# Patient Record
Sex: Male | Born: 1980 | Race: White | Hispanic: No | Marital: Married | Smoking: Never smoker
Health system: Southern US, Community
[De-identification: ages and names within clinical notes are randomized; demographics above are authoritative.]

## PROBLEM LIST (undated history)

## (undated) DIAGNOSIS — S069X9A Unspecified intracranial injury with loss of consciousness of unspecified duration, initial encounter: Secondary | ICD-10-CM

## (undated) DIAGNOSIS — S3992XA Unspecified injury of lower back, initial encounter: Secondary | ICD-10-CM

## (undated) DIAGNOSIS — F329 Major depressive disorder, single episode, unspecified: Secondary | ICD-10-CM

## (undated) DIAGNOSIS — S069XAA Unspecified intracranial injury with loss of consciousness status unknown, initial encounter: Secondary | ICD-10-CM

## (undated) DIAGNOSIS — F32A Depression, unspecified: Secondary | ICD-10-CM

## (undated) HISTORY — PX: BACK SURGERY: SHX140

## (undated) HISTORY — DX: Major depressive disorder, single episode, unspecified: F32.9

## (undated) HISTORY — DX: Depression, unspecified: F32.A

## (undated) HISTORY — PX: SPERMATOCELECTOMY: SHX2420

---

## 2008-03-04 ENCOUNTER — Ambulatory Visit: Payer: Self-pay | Admitting: Internal Medicine

## 2010-06-14 ENCOUNTER — Ambulatory Visit: Payer: Self-pay | Admitting: Internal Medicine

## 2011-04-01 ENCOUNTER — Ambulatory Visit: Payer: Self-pay | Admitting: Internal Medicine

## 2015-04-15 DIAGNOSIS — R1032 Left lower quadrant pain: Secondary | ICD-10-CM | POA: Insufficient documentation

## 2015-04-16 DIAGNOSIS — M5136 Other intervertebral disc degeneration, lumbar region: Secondary | ICD-10-CM | POA: Insufficient documentation

## 2015-09-11 ENCOUNTER — Encounter: Payer: Self-pay | Admitting: Family Medicine

## 2015-09-11 ENCOUNTER — Ambulatory Visit (INDEPENDENT_AMBULATORY_CARE_PROVIDER_SITE_OTHER): Payer: BLUE CROSS/BLUE SHIELD | Admitting: Family Medicine

## 2015-09-11 VITALS — BP 130/100 | HR 86 | Temp 98.3°F | Ht 75.0 in | Wt 240.0 lb

## 2015-09-11 DIAGNOSIS — J0100 Acute maxillary sinusitis, unspecified: Secondary | ICD-10-CM

## 2015-09-11 DIAGNOSIS — J40 Bronchitis, not specified as acute or chronic: Secondary | ICD-10-CM

## 2015-09-11 MED ORDER — GUAIFENESIN-CODEINE 100-10 MG/5ML PO SOLN: 5.0000 mL | Freq: Three times a day (TID) | ORAL | Status: DC | PRN

## 2015-09-11 MED ORDER — AMOXICILLIN-POT CLAVULANATE 875-125 MG PO TABS
1.0000 | ORAL_TABLET | Freq: Two times a day (BID) | ORAL | Status: DC
Start: 2015-09-11 — End: 2015-11-18

## 2015-09-11 NOTE — Patient Instructions (Signed)
The DASH Diet  Dietary Approaches to Stop Hypertension   What is hypertension?  Hypertension is the term for blood pressure that is consistently higher than normal. Blood pressure is the force of blood against artery walls. Blood pressure can be unhealthy if it is above 120/80. The higher your blood pressure, the greater the health risk. High blood pressure can be controlled if you take these steps:  . Maintain a healthy weight.  . Be physically active.  . Follow a healthy eating plan, which includes foods lower in salt and sodium.  . If you drink alcoholic beverages, do so in moderation.  As noted in this list, diet affects high blood pressure. Following the DASH diet and reducing the amount of sodium in your diet will help lower your blood pressure. It will also help prevent high blood pressure.   What is the DASH diet?  Dietary Approaches to Stop Hypertension (DASH) is a diet that is low in saturated fat, cholesterol, and total fat. It emphasizes fruits, vegetables, and low-fat dairy foods. The DASH diet also includes whole-grain products, fish, poultry, and nuts. It encourages fewer servings of red meat, sweets, and sugar-containing beverages. It is rich in magnesium, potassium, and calcium, as well as protein and fiber.   How do I get started on the DASH diet?  The DASH diet requires no special foods and has no hard-to-follow recipes. Start by seeing how DASH compares with your current eating habits. The DASH eating plan shown is based on 2,000 calories a day.   Your health care provider or a dietitian can help you determine how many calories a day you need. Most adults need somewhere between 1600 and 2800 calories a day. Serving sizes will vary between 1/2 cup and 1 1/4 cups. Check the product's nutrition label to determine serving sizes of particular products.  Type of Food Servings for Diet of 2000 Calories/Day  Grains/Grain products (include at least 3 whole grain foods each day) 7-8   Fruits 4-5  Vegetables 4-5  Low fat or non-fat dairy foods 2-3  Lean meats, fish, poultry 2 or less  Nuts, seeds, and legumes 4-5/week  Fats and sweets Limited   Make changes gradually.  Here are some suggestions that might help:  . If you now eat 1 or 2 servings of vegetables a day, add a serving at lunch and another at dinner.  . If you don't eat fruit now or have only juice at breakfast, add a serving to your meals or have it as a snack.  . Drink milk or water with lunch or dinner instead of soda, sugar sweetened tea, or alcohol. Choose low-fat (1%) or fat-free (skim) dairy products to reduce how much saturated fat, total fat, cholesterol, and calories you eat.  . If you have trouble digesting dairy products, try taking lactase enzyme pills or drops (available at drugstores and groceries) with the dairy foods. Or buy lactose-free milk or milk with lactase enzyme added to it.  . Read food labels on margarines and salad dressings to choose products lowest in fat.  . If you now eat large portions of meat, cut back gradually--by a half or a third at each meal. Limit meat to 6 ounces a day (2 servings). Three to four ounces is about the size of a deck of cards.  . Have 2 or more vegetarian-style (meatless) meals each week.   Increase servings of vegetables, rice, pasta, and beans in all meals.  Try casseroles and pasta, and   stir-fry dishes, which have less meat and more vegetables, grains, and beans.  . Use fruits canned in their own juice. Fresh fruits require little or no preparation. Dried fruits are a good choice to carry with you or to have ready in the car.  . Try these snacks ideas: unsalted pretzels or nuts mixed with raisins, graham crackers, low-fat and fat-free yogurt and frozen yogurt, popcorn with no salt or butter added, and raw vegetables.  . Choose whole grain foods to get more nutrients, including minerals and fiber. For example, choose whole-wheat bread or whole-grain cereals.   . Use fresh, frozen, or no-salt-added canned vegetables.   Remember to also reduce the salt and sodium in your diet. Try to have no more than 2000 milligrams (mg) of sodium per day, with a goal of further reducing the sodium to 1500 mg per day. Three important ways to reduce sodium are:  . Use reduced-sodium or no-salt-added food products.  . Use less salt when you prepare foods and do not add salt to your food at the table.  . Read fool labels. Aim for foods that are less than 5 percent of the daily value of sodium.   The DASH eating plan was not designed for weight loss. But it contains many lower calorie foods, such as fruits and vegetables. You can make it lower in calories by replacing higher calorie foods with more fruits and vegetables. Some ideas to increase fruits and vegetables and decrease calories include:  . Eat a medium apple instead of four shortbread cookies. You'll save 80 calories.  . Eat 1/4 cup of dried apricots instead of a 2-ounce bag of pork rinds. You'll save 230 calories.  . Have a hamburger that's 3 ounces instead of 6 ounces. Add a  cup serving of carrots and a 1/2 cup serving of spinach. You'll save more than 200 calories.  . Instead of 5 ounces of chicken, have a stir fry with 2 ounces ofchicken and 1 and 1/2 cups of raw vegetables. Use a small amount of vegetable oil. You'll save 50 calories.  . Have a 1/2 cup serving of low-fat frozen yogurt instead of a 1 and 1/2 ounce milk chocolate bar. You'll save about 110 calories.  . Use low-fat or fat-free condiments, such as fat free salad dressings.  . Eat smaller portions--cut back gradually.  . Use food labels to compare fat content in packaged foods. Items marked low-fat or fat-free may be lower in fat without being lower in calories than their regular versions.  . Limit foods with lots of added sugar, such as pies, flavored yogurts, candy bars, ice cream, sherbet, regular soft drinks, and fruit drinks.  . Drink water  or club soda instead of cola or other soda drinks.    

## 2015-09-11 NOTE — Progress Notes (Signed)
Name: Scott Lynn   MRN: [id number]    DOB: 11-Jul-1981   Date:09/11/2015       Progress Note  Subjective  Chief Complaint  Chief Complaint  Patient presents with  . Establish Care  . Sinusitis    cough and cong    Sinusitis This is a new problem. The current episode started in the past 7 days. The problem has been rapidly worsening since onset. There has been no fever. The pain is mild. Associated symptoms include chills, congestion, coughing, diaphoresis, ear pain, a hoarse voice, shortness of breath, sinus pressure, a sore throat and swollen glands. Pertinent negatives include no headaches or neck pain. Past treatments include acetaminophen and oral decongestants. The treatment provided mild relief.  Cough This is a new problem. The cough is productive of purulent sputum. Associated symptoms include chills, ear pain, a sore throat and shortness of breath. Pertinent negatives include no chest pain, fever, headaches, heartburn, myalgias, rash, weight loss or wheezing. There is no history of environmental allergies.    No problem-specific assessment & plan notes found for this encounter.   No past medical history on file.  Past Surgical History  Procedure Laterality Date  . Spermatocelectomy      No family history on file.  Social History   Social History  . Marital Status: Married    Spouse Name: N/A  . Number of Children: N/A  . Years of Education: N/A   Occupational History  . Not on file.   Social History Main Topics  . Smoking status: Never Smoker   . Smokeless tobacco: Not on file  . Alcohol Use: 0.0 oz/week    0 Standard drinks or equivalent per week  . Drug Use: No  . Sexual Activity: Not on file   Other Topics Concern  . Not on file   Social History Narrative  . No narrative on file    No Known Allergies   Review of Systems  Constitutional: Positive for chills and diaphoresis. Negative for fever, weight loss and malaise/fatigue.  HENT: Positive for  congestion, ear pain, hoarse voice, sinus pressure and sore throat. Negative for ear discharge.   Eyes: Negative for blurred vision.  Respiratory: Positive for cough and shortness of breath. Negative for sputum production and wheezing.   Cardiovascular: Negative for chest pain, palpitations and leg swelling.  Gastrointestinal: Negative for heartburn, nausea, abdominal pain, diarrhea, constipation, blood in stool and melena.  Genitourinary: Negative for dysuria, urgency, frequency and hematuria.  Musculoskeletal: Negative for myalgias, back pain, joint pain and neck pain.  Skin: Negative for rash.  Neurological: Negative for dizziness, tingling, sensory change, focal weakness and headaches.  Endo/Heme/Allergies: Negative for environmental allergies and polydipsia. Does not bruise/bleed easily.  Psychiatric/Behavioral: Negative for depression and suicidal ideas. The patient is not nervous/anxious and does not have insomnia.      Objective  Filed Vitals:   09/11/15 1459  BP: 130/100  Pulse: 86  Temp: 98.3 F (36.8 C)  TempSrc: Oral  Height: 6\' 3"  (1.905 m)  Weight: 240 lb (108.863 kg)  SpO2: 99%    Physical Exam  Constitutional: He is oriented to person, place, and time and well-developed, well-nourished, and in no distress.  HENT:  Head: Normocephalic.  Right Ear: External ear normal.  Left Ear: External ear normal.  Nose: Nose normal.  Mouth/Throat: Oropharynx is clear and moist.  Eyes: Conjunctivae and EOM are normal. Pupils are equal, round, and reactive to light. Right eye exhibits no discharge. Left eye  exhibits no discharge. No scleral icterus.  Neck: Normal range of motion. Neck supple. No JVD present. No tracheal deviation present. No thyromegaly present.  Cardiovascular: Normal rate, regular rhythm, normal heart sounds and intact distal pulses.  Exam reveals no gallop and no friction rub.   No murmur heard. Pulmonary/Chest: Breath sounds normal. No respiratory distress.  He has no wheezes. He has no rales.  Abdominal: Soft. Bowel sounds are normal. He exhibits no mass. There is no hepatosplenomegaly. There is no tenderness. There is no rebound, no guarding and no CVA tenderness.  Musculoskeletal: Normal range of motion. He exhibits no edema or tenderness.  Lymphadenopathy:    He has no cervical adenopathy.  Neurological: He is alert and oriented to person, place, and time. He has normal sensation, normal strength, normal reflexes and intact cranial nerves. No cranial nerve deficit.  Skin: Skin is warm. No rash noted.  Psychiatric: Mood and affect normal.  Nursing note and vitals reviewed.     Assessment & Plan  Problem List Items Addressed This Visit    None    Visit Diagnoses    Bronchitis    -  Primary    Acute maxillary sinusitis, recurrence not specified        Relevant Medications    amoxicillin-clavulanate (AUGMENTIN) 875-125 MG tablet    guaiFENesin-codeine 100-10 MG/5ML syrup         Dr. Hayden [facility]  09/11/2015

## 2015-11-18 ENCOUNTER — Ambulatory Visit (INDEPENDENT_AMBULATORY_CARE_PROVIDER_SITE_OTHER): Payer: BLUE CROSS/BLUE SHIELD | Admitting: Family Medicine

## 2015-11-18 ENCOUNTER — Encounter: Payer: Self-pay | Admitting: Family Medicine

## 2015-11-18 VITALS — BP 120/80 | HR 70 | Ht 75.0 in | Wt 249.0 lb

## 2015-11-18 DIAGNOSIS — J0100 Acute maxillary sinusitis, unspecified: Secondary | ICD-10-CM

## 2015-11-18 DIAGNOSIS — J40 Bronchitis, not specified as acute or chronic: Secondary | ICD-10-CM

## 2015-11-18 LAB — POCT INFLUENZA A/B
INFLUENZA A, POC: NEGATIVE
INFLUENZA B, POC: NEGATIVE

## 2015-11-18 MED ORDER — AMOXICILLIN 500 MG PO CAPS: 500.0000 mg | ORAL_CAPSULE | Freq: Three times a day (TID) | ORAL | Status: DC

## 2015-11-18 NOTE — Progress Notes (Signed)
Name: Scott Lynn   MRN: [id number]    DOB: 09/06/1980   Date:11/18/2015       Progress Note  Subjective  Chief Complaint  Chief Complaint  Patient presents with  . Sinusitis    Sinusitis This is a new problem. The current episode started in the past 7 days. The problem has been gradually worsening since onset. Associated symptoms include chills, congestion, coughing, diaphoresis, ear pain, headaches, sinus pressure, sneezing and swollen glands. Pertinent negatives include no hoarse voice, neck pain, shortness of breath or sore throat. Past treatments include acetaminophen and oral decongestants. The treatment provided mild relief.  Cough This is a new problem. The current episode started in the past 7 days. The problem has been gradually worsening. The cough is productive of purulent sputum (yellow tinged). Associated symptoms include chills, ear pain and headaches. Pertinent negatives include no chest pain, fever, heartburn, myalgias, rash, sore throat, shortness of breath, weight loss or wheezing. The treatment provided mild relief. There is no history of environmental allergies.    No problem-specific assessment & plan notes found for this encounter.   Past Medical History  Diagnosis Date  . Depression     Past Surgical History  Procedure Laterality Date  . Spermatocelectomy      History reviewed. No pertinent family history.  Social History   Social History  . Marital Status: Married    Spouse Name: N/A  . Number of Children: N/A  . Years of Education: N/A   Occupational History  . Not on file.   Social History Main Topics  . Smoking status: Never Smoker   . Smokeless tobacco: Not on file  . Alcohol Use: 0.0 oz/week    0 Standard drinks or equivalent per week  . Drug Use: No  . Sexual Activity: Not on file   Other Topics Concern  . Not on file   Social History Narrative    No Known Allergies   Review of Systems  Constitutional: Positive for chills and  diaphoresis. Negative for fever, weight loss and malaise/fatigue.  HENT: Positive for congestion, ear pain, sinus pressure and sneezing. Negative for ear discharge, hoarse voice and sore throat.   Eyes: Negative for blurred vision.  Respiratory: Positive for cough. Negative for sputum production, shortness of breath and wheezing.   Cardiovascular: Negative for chest pain, palpitations and leg swelling.  Gastrointestinal: Negative for heartburn, nausea, abdominal pain, diarrhea, constipation, blood in stool and melena.  Genitourinary: Negative for dysuria, urgency, frequency and hematuria.  Musculoskeletal: Negative for myalgias, back pain, joint pain and neck pain.  Skin: Negative for rash.  Neurological: Positive for headaches. Negative for dizziness, tingling, sensory change and focal weakness.  Endo/Heme/Allergies: Negative for environmental allergies and polydipsia. Does not bruise/bleed easily.  Psychiatric/Behavioral: Negative for depression and suicidal ideas. The patient is not nervous/anxious and does not have insomnia.      Objective  Filed Vitals:   11/18/15 0951  BP: 120/80  Pulse: 70  Height:  (1.905 m)  Weight: 249 lb (112.946 kg)    Physical Exam  Constitutional: He is oriented to person, place, and time and well-developed, well-nourished, and in no distress.  HENT:  Head: Normocephalic.  Right Ear: External ear normal.  Left Ear: External ear normal.  Nose: Nose normal.  Mouth/Throat: Oropharynx is clear and moist.  Eyes: Conjunctivae and EOM are normal. Pupils are equal, round, and reactive to light. Right eye exhibits no discharge. Left eye exhibits no discharge. No scleral icterus.  Neck: Normal range of motion. Neck supple. No JVD present. No tracheal deviation present. No thyromegaly present.  Cardiovascular: Normal rate, regular rhythm, normal heart sounds and intact distal pulses.  Exam reveals no gallop and no friction rub.   No murmur  heard. Pulmonary/Chest: Breath sounds normal. No respiratory distress. He has no wheezes. He has no rales.  Abdominal: Soft. Bowel sounds are normal. He exhibits no mass. There is no hepatosplenomegaly. There is no tenderness. There is no rebound, no guarding and no CVA tenderness.  Musculoskeletal: Normal range of motion. He exhibits no edema or tenderness.  Lymphadenopathy:    He has no cervical adenopathy.  Neurological: He is alert and oriented to person, place, and time. He has normal sensation, normal strength, normal reflexes and intact cranial nerves. No cranial nerve deficit.  Skin: Skin is warm. No rash noted.  Psychiatric: Mood and affect normal.  Nursing note and vitals reviewed.     Assessment & Plan  Problem List Items Addressed This Visit    None    Visit Diagnoses    Bronchitis    -  Primary    Relevant Orders    POCT Influenza A/B (Completed)    Acute maxillary sinusitis, recurrence not specified        Relevant Medications    amoxicillin (AMOXIL) 500 MG capsule    Other Relevant Orders    POCT Influenza A/B (Completed)         Dr. Hayden Rasmusseneanna Jones [facility]  11/18/2015

## 2016-05-26 ENCOUNTER — Ambulatory Visit
Admission: EM | Admit: 2016-05-26 | Discharge: 2016-05-26 | Disposition: A | Discharge status: Home / Self Care | Payer: BLUE CROSS/BLUE SHIELD | Attending: Family Medicine | Admitting: Family Medicine

## 2016-05-26 DIAGNOSIS — S39011A Strain of muscle, fascia and tendon of abdomen, initial encounter: Secondary | ICD-10-CM | POA: Diagnosis not present

## 2016-05-26 DIAGNOSIS — S76219A Strain of adductor muscle, fascia and tendon of unspecified thigh, initial encounter: Secondary | ICD-10-CM

## 2016-05-26 HISTORY — DX: Unspecified injury of lower back, initial encounter: S39.92XA

## 2016-05-26 LAB — URINALYSIS COMPLETE WITH MICROSCOPIC (ARMC ONLY)
BACTERIA UA: NONE SEEN
Bilirubin Urine: NEGATIVE
GLUCOSE, UA: NEGATIVE mg/dL
KETONES UR: NEGATIVE mg/dL
LEUKOCYTES UA: NEGATIVE
NITRITE: NEGATIVE
Squamous Epithelial / LPF: NONE SEEN
pH: 5.5 (ref 5.0–8.0)

## 2016-05-26 MED ORDER — PREDNISONE 10 MG (21) PO TBPK: ORAL_TABLET | ORAL | 0 refills | Status: DC

## 2016-05-26 MED ORDER — ORPHENADRINE CITRATE ER 100 MG PO TB12: 100.0000 mg | ORAL_TABLET | Freq: Two times a day (BID) | ORAL | 0 refills | Status: DC

## 2016-05-26 MED ORDER — MELOXICAM 15 MG PO TABS: 15.0000 mg | ORAL_TABLET | Freq: Every day | ORAL | 0 refills | Status: DC

## 2016-05-26 NOTE — ED Triage Notes (Addendum)
Pt reports left groin pain and he is concerned it is a hernia. Area tender to touch. [facility] and [facility] CT scan which was negative. Pain is 6/10 and much worse with movement. Denies urinary sx. Ultimately it was determined it was associated with his back injuries and was sent to the pain clinic for mgt. Has been getting steroid injections and last one was July or August.

## 2016-05-26 NOTE — ED Provider Notes (Signed)
[facility]    CSN: [id number] Arrival date & time: 05/26/16  1350     History   Chief Complaint Chief Complaint  Patient presents with  . Groin Pain    HPI Scott Lynn is a 35 y.o. male.   Patient is here because of pain in the left groin area. He is worried that he has a hernia. 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 He gets injections to try to keep his back pain under control which is not the most successful treatment. States that last year he started having up when he felt a bulge in the left groin area. [facility] He had a CT scan last year which was negative he had to go on temporary disability because the pain in the groin area on the back and finally things to get better. He limits his activity now because of his back and states that about for 5 days ago that left groin area started getting irritated and started becoming painful again. 2 days ago he had to leave work because the pain he is comes in today concerned that something is going on with a hernia in his left groin area. He's had a history of back injury depression andt degenerative changes of his spine.   The history is provided by the patient. No language interpreter was used.  Groin Pain  This is a recurrent problem. The current episode started more than 2 days ago. The problem occurs constantly. The problem has not changed since onset.Pertinent negatives include no chest pain, no abdominal pain and no headaches. Nothing relieves the symptoms. He has tried nothing for the symptoms. The treatment provided no relief.    Past Medical History:  Diagnosis Date  . Back injury   . Depression     Patient Active Problem List   Diagnosis Date Noted  . Degeneration of intervertebral disc of lumbar region 04/16/2015  . Abdominal pain, left lower quadrant 04/15/2015    Past Surgical History:  Procedure Laterality Date  . SPERMATOCELECTOMY          Home Medications    Prior to Admission medications   Medication Sig Start Date End Date Taking? Authorizing Provider  amoxicillin (AMOXIL) 500 MG capsule Take 1 capsule (500 mg total) by mouth 3 (three) times daily. 11/18/15   Duanne Limerickeanna C Jones, MD  citalopram (CELEXA) 40 MG tablet Take 40 mg by mouth daily. [facility]    Historical Provider, MD  ibuprofen (ADVIL,MOTRIN) 800 MG tablet Take 1 tablet by mouth as needed.    Historical Provider, MD  meloxicam (MOBIC) 15 MG tablet Take 1 tablet (15 mg total) by mouth daily. 05/26/16   Hassan RowanEugene Jniyah Dantuono, MD  orphenadrine (NORFLEX) 100 MG tablet Take 1 tablet (100 mg total) by mouth 2 (two) times daily. 05/26/16   Hassan RowanEugene Chevi Lim, MD  prazosin (MINIPRESS) 1 MG capsule Take 3 capsules by mouth at bedtime.    Historical Provider, MD  predniSONE (STERAPRED UNI-PAK 21 TAB) 10 MG (21) TBPK tablet Sig 6 tablet day 1, 5 tablets day 2, 4 tablets day 3,,3tablets day 4, 2 tablets day 5, 1 tablet day 6 take all tablets orally 05/26/16   Hassan RowanEugene Damareon Lanni, MD    Family History History reviewed. No pertinent family history.  Social History Social History  Substance Use Topics  . Smoking status: Never Smoker  . Smokeless tobacco: Never Used  .  Alcohol use 0.0 oz/week     Comment: 6 pack per week     Allergies   Review of patient's allergies indicates no known allergies.   Review of Systems Review of Systems  Cardiovascular: Negative for chest pain.  Gastrointestinal: Negative for abdominal pain.  Musculoskeletal: Positive for back pain and myalgias.  Neurological: Negative for headaches.  All other systems reviewed and are negative.    Physical Exam Triage Vital Signs ED Triage Vitals  Enc Vitals Group     BP 05/26/16 1419 113/69     Pulse Rate 05/26/16 1419 77     Resp 05/26/16 1419 20     Temp 05/26/16 1419 98.7 F (37.1 C)     Temp Source 05/26/16 1419 Oral     SpO2 05/26/16 1419 100 %     Weight 05/26/16 1421 230 lb (104.3 kg)     Height 05/26/16  1421 6\' 3"  (1.905 m)     Head Circumference --      Peak Flow --      Pain Score 05/26/16 1422 6     Pain Loc --      Pain Edu? --      Excl. in GC? --    No data found.   Updated Vital Signs BP 113/69 (BP Location: Left Arm)   Pulse 77   Temp 98.7 F (37.1 C) (Oral)   Resp 20   Ht 6\' 3"  (1.905 m)   Wt 230 lb (104.3 kg)   SpO2 100%   BMI 28.75 kg/m   Visual Acuity Right Eye Distance:   Left Eye Distance:   Bilateral Distance:    Right Eye Near:   Left Eye Near:    Bilateral Near:     Physical Exam  Constitutional: He appears well-developed and well-nourished.  HENT:  Head: Normocephalic and atraumatic.  Eyes: Pupils are equal, round, and reactive to light.  Neck: Normal range of motion. Neck supple.  Abdominal: He exhibits no mass. There is no hepatosplenomegaly. There is tenderness. There is no rebound and no CVA tenderness. No hernia.    Patient has tenderness in his left groin area which feels consistent with a left groin tear  Musculoskeletal: Normal range of motion. He exhibits tenderness.  Neurological: He is alert. He has normal reflexes.  Skin: Skin is warm.  Psychiatric: He has a normal mood and affect.     UC Treatments / Results  Labs (all labs ordered are listed, but only abnormal results are displayed) Labs Reviewed  URINALYSIS COMPLETEWITH MICROSCOPIC ([facility] ONLY) - Abnormal; Notable for the following:       Result Value   Specific Gravity, Urine >1.030 (*)    Hgb urine dipstick TRACE (*)    Protein, ur TRACE (*)    All other components within normal limits    EKG  EKG Interpretation None       Radiology No results found.  Procedures Procedures (including critical care time)  Medications Ordered in UC Medications - No data to display   Initial Impression / Assessment and Plan / UC Course  I have reviewed the triage vital signs and the nursing notes.  Pertinent labs & imaging results that were available during my care of  the patient were reviewed by me and considered in my medical decision making (see chart for details).  Clinical Course   I have explained to the patient that I think he has a left groin tear.  I think the tear was  present last year and that the CT scan was negative for hernia but the CT scan is not the best test for a groin tear. I explained to her MRI would be probably the best test but doubtful any insurance is going to authorize an MRI at the CT scan was negative for a surgical purpose since this no treatment for groin tear other than anti-inflammatory and palliative treatment. I will place him on a six-day course of prednisone to see if it helps Mobic 15 mg and Norflex muscle relaxer. Hopefully this will make a difference and we'll give him a work note for today and tomorrow  Final Clinical Impressions(s) / UC Diagnoses   Final diagnoses:  Groin strain, initial encounter    New Prescriptions New Prescriptions   MELOXICAM (MOBIC) 15 MG TABLET    Take 1 tablet (15 mg total) by mouth daily.   ORPHENADRINE (NORFLEX) 100 MG TABLET    Take 1 tablet (100 mg total) by mouth 2 (two) times daily.   PREDNISONE (STERAPRED UNI-PAK 21 TAB) 10 MG (21) TBPK TABLET    Sig 6 tablet day 1, 5 tablets day 2, 4 tablets day 3,,3tablets day 4, 2 tablets day 5, 1 tablet day 6 take all tablets orally     Hassan Rowan, MD 05/26/16 [id number]

## 2018-02-28 ENCOUNTER — Ambulatory Visit
Admission: EM | Admit: 2018-02-28 | Discharge: 2018-02-28 | Disposition: A | Discharge status: Home / Self Care | Payer: BLUE CROSS/BLUE SHIELD | Attending: Family Medicine | Admitting: Family Medicine

## 2018-02-28 ENCOUNTER — Other Ambulatory Visit: Payer: Self-pay

## 2018-02-28 DIAGNOSIS — H6983 Other specified disorders of Eustachian tube, bilateral: Secondary | ICD-10-CM | POA: Diagnosis not present

## 2018-02-28 DIAGNOSIS — R059 Cough, unspecified: Secondary | ICD-10-CM

## 2018-02-28 DIAGNOSIS — R05 Cough: Secondary | ICD-10-CM

## 2018-02-28 DIAGNOSIS — J069 Acute upper respiratory infection, unspecified: Secondary | ICD-10-CM

## 2018-02-28 DIAGNOSIS — L089 Local infection of the skin and subcutaneous tissue, unspecified: Secondary | ICD-10-CM | POA: Diagnosis not present

## 2018-02-28 HISTORY — DX: Unspecified intracranial injury with loss of consciousness status unknown, initial encounter: S06.9XAA

## 2018-02-28 HISTORY — DX: Unspecified intracranial injury with loss of consciousness of unspecified duration, initial encounter: S06.9X9A

## 2018-02-28 MED ORDER — AMOXICILLIN-POT CLAVULANATE 875-125 MG PO TABS: 1.0000 | ORAL_TABLET | Freq: Two times a day (BID) | ORAL | 0 refills | Status: AC

## 2018-02-28 MED ORDER — BENZONATATE 100 MG PO CAPS: 100.0000 mg | ORAL_CAPSULE | Freq: Three times a day (TID) | ORAL | 0 refills | Status: DC | PRN

## 2018-02-28 NOTE — Discharge Instructions (Signed)
Recommend start Augmentin 875mg  twice a day as directed. May use Tessalon cough pills 1-2 every 8 hours as needed for cough. Increase fluid intake to help loosen up mucus. Continue decongestant use as needed for sinus congestion and ear pain. May use OTC Lamisil twice a day as directed on right toes/foot. Recommend follow-up with your PCP in 5 to 6 days if not improving.

## 2018-02-28 NOTE — ED Provider Notes (Signed)
[facility]    CSN: [id number] Arrival date & time: 02/28/18  0908     History   Chief Complaint Chief Complaint  Patient presents with  . Otalgia  . Cough    HPI Scott Lynn is a 37 y.o. male.   37 year old male presents with nasal congestion, sore throat, left ear pain and cough for over 5 to 6 days. Was improving 3 days ago but now has a low grade fever and worsening congestion over the past 2 days. Denies any GI symptoms. Has tried Vicks and various OTC cold and cough medication with minimal relief.  Other concern involves injury to right 5th toe. He hit his toe while swimming and split open the skin between his 4th and 5th toe about 4 days ago. Now area is more red, irritated with slight yellowish drainage. He cleaned area with hydrogen peroxide with minimal relief. Coaches daily and his feet often sweat a lot which has not helped his skin. Other chronic health issues include hx of traumatic brain injury, depression and back pain and currently on Celexa, Tegretol, Neurontin, Concerta, Bupropion daily as well as Minipress and Verapamil.   The history is provided by the patient.    Past Medical History:  Diagnosis Date  . Back injury   . Depression   . TBI (traumatic brain injury) [facility](HCC)     Patient Active Problem List   Diagnosis Date Noted  . Degeneration of intervertebral disc of lumbar region 04/16/2015  . Abdominal pain, left lower quadrant 04/15/2015    Past Surgical History:  Procedure Laterality Date  . BACK SURGERY    . SPERMATOCELECTOMY         Home Medications    Prior to Admission medications   Medication Sig Start Date End Date Taking? Authorizing Provider  amoxicillin-clavulanate (AUGMENTIN) 875-125 MG tablet Take 1 tablet by mouth every 12 (twelve) hours for 7 days. 02/28/18 03/07/18  Sudie GrumblingAmyot, Ann Berry, NP  benzonatate (TESSALON) 100 MG capsule Take 1 capsule (100 mg total) by mouth 3 (three) times daily as needed for cough. 02/28/18   Sudie GrumblingAmyot,  Ann Berry, NP  carbamazepine (TEGRETOL) 200 MG tablet Take by mouth.    [provider]  citalopram (CELEXA) 40 MG tablet Take 40 mg by mouth daily. [facility]    [provider]  CONCERTA 27 MG CR tablet  02/23/18   [provider]  gabapentin (NEURONTIN) 300 MG capsule TAKE 1 CAPSULE THREE TIMES DAILY FOR ANXIETY 01/26/18   [provider]  ibuprofen (ADVIL,MOTRIN) 800 MG tablet Take 1 tablet by mouth as needed.    [provider]  prazosin (MINIPRESS) 1 MG capsule Take 3 capsules by mouth at bedtime.    [provider]    Family History History reviewed. No pertinent family history.  Social History Social History   Tobacco Use  . Smoking status: Never Smoker  . Smokeless tobacco: Current User    Types: Snuff  Substance Use Topics  . Alcohol use: Yes    Alcohol/week: 0.0 oz    Comment: social  . Drug use: No     Allergies   Patient has no known allergies.   Review of Systems Review of Systems  Constitutional: Positive for fatigue and fever. Negative for activity change, appetite change and chills.  HENT: Positive for congestion, ear pain, postnasal drip, rhinorrhea, sinus pressure and sore throat. Negative for ear discharge, facial swelling, mouth sores, nosebleeds, sinus pain, sneezing and trouble swallowing.  Eyes: Negative for pain, discharge, redness and itching.  Respiratory: Positive for cough and chest tightness. Negative for shortness of breath and wheezing.   Cardiovascular: Negative for chest pain and palpitations.  Gastrointestinal: Negative for diarrhea, nausea and vomiting.  Musculoskeletal: Positive for arthralgias and back pain (chronic). Negative for gait problem, joint swelling, myalgias, neck pain and neck stiffness.  Skin: Positive for color change and wound. Negative for rash.  Allergic/Immunologic: Negative for environmental allergies and immunocompromised state.  Neurological: Positive for headaches.  Negative for dizziness, tremors, seizures, syncope, weakness, light-headedness and numbness.  Hematological: Negative for adenopathy. Does not bruise/bleed easily.     Physical Exam Triage Vital Signs ED Triage Vitals  Enc Vitals Group     BP 02/28/18 0918 114/68     Pulse Rate 02/28/18 0918 81     Resp 02/28/18 0918 18     Temp 02/28/18 0918 98 F (36.7 C)     Temp Source 02/28/18 0918 Oral     SpO2 02/28/18 0918 100 %     Weight 02/28/18 0920 230 lb (104.3 kg)     Height 02/28/18 0920 6\' 3"  (1.905 m)     Head Circumference --      Peak Flow --      Pain Score 02/28/18 0920 0     Pain Loc --      Pain Edu? --      Excl. in GC? --    No data found.  Updated Vital Signs BP 114/68 (BP Location: Left Arm)   Pulse 81   Temp 98 F (36.7 C) (Oral)   Resp 18   Ht 6\' 3"  (1.905 m)   Wt 230 lb (104.3 kg)   SpO2 100%   BMI 28.75 kg/m   Visual Acuity Right Eye Distance:   Left Eye Distance:   Bilateral Distance:    Right Eye Near:   Left Eye Near:    Bilateral Near:     Physical Exam  Constitutional: He is oriented to person, place, and time. Vital signs are normal. He appears well-developed and well-nourished. He is cooperative. He appears ill. No distress.  Patient sitting on exam table in no acute distress.   HENT:  Head: Normocephalic and atraumatic.  Right Ear: Hearing, external ear and ear canal normal. Tympanic membrane is bulging. Tympanic membrane is not injected and not erythematous. No middle ear effusion.  Left Ear: Hearing, external ear and ear canal normal. Tympanic membrane is bulging. Tympanic membrane is not injected and not erythematous. A middle ear effusion is present.  Nose: Mucosal edema and rhinorrhea present. Right sinus exhibits no maxillary sinus tenderness and no frontal sinus tenderness. Left sinus exhibits no maxillary sinus tenderness and no frontal sinus tenderness.  Mouth/Throat: Uvula is midline and mucous membranes are normal. Posterior  oropharyngeal erythema present.  Eyes: Conjunctivae and EOM are normal.  Neck: Normal range of motion. Neck supple.  Cardiovascular: Normal rate, regular rhythm and normal heart sounds.  No murmur heard. Pulmonary/Chest: Effort normal. No respiratory distress. He has no decreased breath sounds. He has no wheezes. He has no rhonchi. He has no rales.  Musculoskeletal: Normal range of motion.       Right foot: There is tenderness. There is normal range of motion, no swelling, normal capillary refill, no crepitus and no deformity.       Feet:  Slight swelling and redness present between 4th and 5th toes on right foot. Skin excoriated around area with slight yellowish drainage.  Tender. Slight cracking of skin on plantar area below 5th toe as well as between 3rd and 4th toes. Good pulses and capillary refill. No neuro deficits noted.   Feet:  Right Foot:  Skin Integrity: Positive for skin breakdown and erythema. Negative for ulcer, blister, warmth, callus or dry skin.  Lymphadenopathy:    He has cervical adenopathy.       Right cervical: Superficial cervical adenopathy present.       Left cervical: Superficial cervical adenopathy present.  Neurological: He is alert and oriented to person, place, and time. He has normal strength. No sensory deficit.  Skin: Skin is warm and dry. Capillary refill takes less than 2 seconds. No bruising, no ecchymosis and no petechiae noted. There is erythema.  Psychiatric: He has a normal mood and affect. His behavior is normal. Judgment and thought content normal.  Vitals reviewed.    UC Treatments / Results  Labs (all labs ordered are listed, but only abnormal results are displayed) Labs Reviewed - No data to display  EKG None  Radiology No results found.  Procedures Procedures (including critical care time)  Medications Ordered in UC Medications - No data to display  Initial Impression / Assessment and Plan / UC Course  I have reviewed the triage  vital signs and the nursing notes.  Pertinent labs & imaging results that were available during my care of the patient were reviewed by me and considered in my medical decision making (see chart for details).    Discussed with patient that he appears to have a URI- probable viral. Discussed that he also has a mild skin infection- possibly bacterial between his toes. Will treat with Augmentin 875mg  twice a day as directed. Reviewed that he has underlying mild athlete's foot (fungal infection) of his right foot. Recommend OTC Lamisil as directed. Try to keep area clean and dry. Change socks frequently and wear sandals in public showers. May use Tessalon cough pills every 8 hours as needed for his cough. Increase fluid intake to help loosen up mucus. May continue OTC Decongestant for sinus pressure and ear pain. Note written for work. Follow-up with his PCP in 5 to 6 days if not improving.  Final Clinical Impressions(s) / UC Diagnoses   Final diagnoses:  Upper respiratory tract infection, unspecified type  Dysfunction of both eustachian tubes  Cough  Infection of skin of toes     Discharge Instructions     Recommend start Augmentin 875mg  twice a day as directed. May use Tessalon cough pills 1-2 every 8 hours as needed for cough. Increase fluid intake to help loosen up mucus. Continue decongestant use as needed for sinus congestion and ear pain. May use OTC Lamisil twice a day as directed on right toes/foot. Recommend follow-up with your PCP in 5 to 6 days if not improving.     ED Prescriptions    Medication Sig Dispense Auth. Provider   amoxicillin-clavulanate (AUGMENTIN) 875-125 MG tablet Take 1 tablet by mouth every 12 (twelve) hours for 7 days. 14 tablet Amyot, Ali Lowe, NP   benzonatate (TESSALON) 100 MG capsule Take 1 capsule (100 mg total) by mouth 3 (three) times daily as needed for cough. 21 capsule Sudie Grumbling, NP     Controlled Substance Prescriptions [place] Controlled Substance  Registry consulted? Not Applicable   Sudie Grumbling, NP 02/28/18 [phone]

## 2018-02-28 NOTE — ED Triage Notes (Addendum)
Pt with URI x past several days. Cough, left otalgia, nasal drainage. Also wants to have his toe looked at on right foot as he thinks he has an infection.

## 2018-09-07 ENCOUNTER — Encounter: Payer: Self-pay | Admitting: Emergency Medicine

## 2018-09-07 ENCOUNTER — Ambulatory Visit
Admission: EM | Admit: 2018-09-07 | Discharge: 2018-09-07 | Disposition: A | Discharge status: Home / Self Care | Payer: BLUE CROSS/BLUE SHIELD | Attending: Family Medicine | Admitting: Family Medicine

## 2018-09-07 ENCOUNTER — Other Ambulatory Visit: Payer: Self-pay

## 2018-09-07 DIAGNOSIS — R6883 Chills (without fever): Secondary | ICD-10-CM

## 2018-09-07 DIAGNOSIS — R1110 Vomiting, unspecified: Secondary | ICD-10-CM | POA: Diagnosis not present

## 2018-09-07 DIAGNOSIS — B349 Viral infection, unspecified: Secondary | ICD-10-CM | POA: Insufficient documentation

## 2018-09-07 DIAGNOSIS — R51 Headache: Secondary | ICD-10-CM

## 2018-09-07 LAB — RAPID INFLUENZA A&B ANTIGENS
Influenza A (ARMC): NEGATIVE
Influenza B (ARMC): NEGATIVE

## 2018-09-07 MED ORDER — BENZONATATE 100 MG PO CAPS: 100.0000 mg | ORAL_CAPSULE | Freq: Three times a day (TID) | ORAL | 0 refills | Status: DC | PRN

## 2018-09-07 NOTE — ED Provider Notes (Signed)
[facility]    CSN: [id number] Arrival date & time: 09/07/18  1028  History   Chief Complaint Chief Complaint  Patient presents with  . Cough  . Generalized Body Aches   HPI  38 year old male presents with concerns for influenza.  Patient reports he has been sick since yesterday.  Reports cough, headache, vomiting, chills, ear pain, neck pain.  No documented fever.  Patient works at a [employer].  He is concerned about influenza.  No known exacerbating relieving factors.  No other reported symptoms.  No other complaints.  History reviewed and updated as below.  Past Medical History:  Diagnosis Date  . Back injury   . Depression   . TBI (traumatic brain injury) [facility](HCC)    Patient Active Problem List   Diagnosis Date Noted  . Degeneration of intervertebral disc of lumbar region 04/16/2015  . Abdominal pain, left lower quadrant 04/15/2015   Past Surgical History:  Procedure Laterality Date  . BACK SURGERY    . SPERMATOCELECTOMY      Home Medications    Prior to Admission medications   Medication Sig Start Date End Date Taking? Authorizing Provider  carbamazepine (TEGRETOL) 200 MG tablet Take by mouth.   Yes [provider]  citalopram (CELEXA) 40 MG tablet Take 40 mg by mouth daily. [facility]   Yes [provider]  CONCERTA 27 MG CR tablet  02/23/18  Yes [provider]  gabapentin (NEURONTIN) 300 MG capsule TAKE 1 CAPSULE THREE TIMES DAILY FOR ANXIETY 01/26/18  Yes [provider]  prazosin (MINIPRESS) 1 MG capsule Take 3 capsules by mouth at bedtime.   Yes [provider]  benzonatate (TESSALON) 100 MG capsule Take 1 capsule (100 mg total) by mouth 3 (three) times daily as needed. 09/07/18   Tommie Samsook, Cristopher Ciccarelli G, DO  buPROPion (WELLBUTRIN XL) 300 MG 24 hr tablet Take by mouth.    [provider]  ibuprofen (ADVIL,MOTRIN) 800 MG tablet Take 1 tablet by mouth as needed.    [provider]   Social  History Social History   Tobacco Use  . Smoking status: Never Smoker  . Smokeless tobacco: Former NeurosurgeonUser    Types: Snuff  Substance Use Topics  . Alcohol use: Yes    Alcohol/week: 0.0 standard drinks    Comment: social  . Drug use: Yes    Types: Marijuana     Allergies   Patient has no known allergies.   Review of Systems Review of Systems  Constitutional: Positive for chills. Negative for fever.  Respiratory: Positive for cough.   Gastrointestinal: Positive for vomiting.  Musculoskeletal: Positive for neck pain.  Neurological: Positive for headaches.   Physical Exam Triage Vital Signs ED Triage Vitals  Enc Vitals Group     BP 09/07/18 1051 124/89     Pulse Rate 09/07/18 1051 78     Resp 09/07/18 1051 16     Temp 09/07/18 1051 98.1 F (36.7 C)     Temp Source 09/07/18 1051 Oral     SpO2 09/07/18 1051 98 %     Weight 09/07/18 1047 250 lb (113.4 kg)     Height 09/07/18 1047 6\' 3"  (1.905 m)     Head Circumference --      Peak Flow --      Pain Score 09/07/18 1047 3     Pain Loc --      Pain Edu? --      Excl. in GC? --  Updated Vital Signs BP 124/89 (BP Location: Left Arm)   Pulse 78   Temp 98.1 F (36.7 C) (Oral)   Resp 16   Ht 6\' 3"  (1.905 m)   Wt 113.4 kg   SpO2 98%   BMI 31.25 kg/m   Visual Acuity Right Eye Distance:   Left Eye Distance:   Bilateral Distance:    Right Eye Near:   Left Eye Near:    Bilateral Near:     Physical Exam Vitals signs and nursing note reviewed.  Constitutional:      General: He is not in acute distress. HENT:     Head: Normocephalic and atraumatic.     Right Ear: Tympanic membrane normal.     Left Ear: Tympanic membrane normal.     Mouth/Throat:     Pharynx: Oropharynx is clear. Posterior oropharyngeal erythema present.  Eyes:     General:        Right eye: No discharge.        Left eye: No discharge.     Conjunctiva/sclera: Conjunctivae normal.  Cardiovascular:     Rate and Rhythm: Normal rate and regular  rhythm.  Pulmonary:     Effort: Pulmonary effort is normal.     Breath sounds: No wheezing, rhonchi or rales.  Neurological:     Mental Status: He is alert.  Psychiatric:        Mood and Affect: Mood normal.        Behavior: Behavior normal.     UC Treatments / Results  Labs (all labs ordered are listed, but only abnormal results are displayed) Labs Reviewed  RAPID INFLUENZA A&B ANTIGENS ([facility] ONLY)    EKG None  Radiology No results found.  Procedures Procedures (including critical care time)  Medications Ordered in UC Medications - No data to display  Initial Impression / Assessment and Plan / UC Course  I have reviewed the triage vital signs and the nursing notes.  Pertinent labs & imaging results that were available during my care of the patient were reviewed by me and considered in my medical decision making (see chart for details).    38 year old male presents with a viral illness.  Flu negative.  Tessalon Perles for cough.  Supportive care.  Work note given.  Final Clinical Impressions(s) / UC Diagnoses   Final diagnoses:  Viral illness     Discharge Instructions     Rest. Fluids.  Cough medication if needed.  Ibuprofen as needed.  Take care  Dr. Adriana Simas    ED Prescriptions    Medication Sig Dispense Auth. Provider   benzonatate (TESSALON) 100 MG capsule Take 1 capsule (100 mg total) by mouth 3 (three) times daily as needed. 30 capsule Tommie Sams, DO     Controlled Substance Prescriptions [place] Controlled Substance Registry consulted? Not Applicable   Tommie Sams, DO 09/07/18 1231

## 2018-09-07 NOTE — Discharge Instructions (Addendum)
Rest. Fluids.  Cough medication if needed.  Ibuprofen as needed.  Take care  Dr. Adriana Simas

## 2018-09-07 NOTE — ED Triage Notes (Signed)
Patient c/o cough, runny nose, HAs, and bodyaches that started yesterday.  Patient also reports sinus pressure.

## 2020-01-30 ENCOUNTER — Other Ambulatory Visit: Payer: Self-pay

## 2020-01-30 ENCOUNTER — Encounter: Payer: Self-pay | Admitting: Emergency Medicine

## 2020-01-30 ENCOUNTER — Ambulatory Visit
Admission: EM | Admit: 2020-01-30 | Discharge: 2020-01-30 | Disposition: A | Discharge status: Home / Self Care | Payer: BC Managed Care – PPO | Attending: Family Medicine | Admitting: Family Medicine

## 2020-01-30 ENCOUNTER — Ambulatory Visit (INDEPENDENT_AMBULATORY_CARE_PROVIDER_SITE_OTHER): Payer: BC Managed Care – PPO

## 2020-01-30 DIAGNOSIS — J988 Other specified respiratory disorders: Secondary | ICD-10-CM | POA: Diagnosis not present

## 2020-01-30 DIAGNOSIS — B9789 Other viral agents as the cause of diseases classified elsewhere: Secondary | ICD-10-CM | POA: Diagnosis not present

## 2020-01-30 MED ORDER — ALBUTEROL SULFATE HFA 108 (90 BASE) MCG/ACT IN AERS: 1.0000 | INHALATION_SPRAY | Freq: Four times a day (QID) | RESPIRATORY_TRACT | 0 refills | Status: AC | PRN

## 2020-01-30 MED ORDER — BENZONATATE 200 MG PO CAPS: 200.0000 mg | ORAL_CAPSULE | Freq: Three times a day (TID) | ORAL | 0 refills | Status: AC | PRN

## 2020-01-30 NOTE — ED Provider Notes (Signed)
[facility]    CSN: [id number] Arrival date & time: 01/30/20  1442      History   Chief Complaint Chief Complaint  Patient presents with   Cough   Shortness of Breath   Headache   HPI  39 year old male presents with the above complaints.  Patient reports that his symptoms started yesterday.  He reports cough, shortness of breath, headache, body aches.  No documented fever.  He has had some subjective fever.  No reported sick contacts.  No relieving factors.  Patient is concerned that he has acute bronchitis.  Rates his pain a 6/10 in severity.  No other associated symptoms.  No other complaints.  Declines Covid testing.  Past Medical History:  Diagnosis Date   Back injury    Depression    TBI (traumatic brain injury) [facility])     Patient Active Problem List   Diagnosis Date Noted   Degeneration of intervertebral disc of lumbar region 04/16/2015   Abdominal pain, left lower quadrant 04/15/2015    Past Surgical History:  Procedure Laterality Date   BACK SURGERY     SPERMATOCELECTOMY         Home Medications    Prior to Admission medications   Medication Sig Start Date End Date Taking? Authorizing Provider  citalopram (CELEXA) 40 MG tablet Take 40 mg by mouth daily. [facility]   Yes [provider]  CONCERTA 27 MG CR tablet  02/23/18  Yes [provider]  prazosin (MINIPRESS) 1 MG capsule Take 3 capsules by mouth at bedtime.   Yes [provider]  albuterol (VENTOLIN HFA) 108 (90 Base) MCG/ACT inhaler Inhale 1-2 puffs into the lungs every 6 (six) hours as needed for wheezing or shortness of breath. 01/30/20   Tommie Sams, DO  benzonatate (TESSALON) 200 MG capsule Take 1 capsule (200 mg total) by mouth 3 (three) times daily as needed for cough. 01/30/20   Tommie Sams, DO  ibuprofen (ADVIL,MOTRIN) 800 MG tablet Take 1 tablet by mouth as needed.    [provider]  buPROPion (WELLBUTRIN XL) 300 MG 24 hr tablet Take by  mouth.  01/30/20  [provider]  carbamazepine (TEGRETOL) 200 MG tablet Take by mouth.  01/30/20  [provider]  gabapentin (NEURONTIN) 300 MG capsule TAKE 1 CAPSULE THREE TIMES DAILY FOR ANXIETY 01/26/18 01/30/20  [provider]   Social History Social History   Tobacco Use   Smoking status: Never Smoker   Smokeless tobacco: Former Neurosurgeon    Types: Snuff  Substance Use Topics   Alcohol use: Yes    Alcohol/week: 0.0 standard drinks    Comment: social   Drug use: Not Currently    Types: Marijuana     Allergies   Patient has no known allergies.   Review of Systems Review of Systems  Respiratory: Positive for cough and shortness of breath.   Musculoskeletal:       Body aches.  Neurological: Positive for headaches.   Physical Exam Triage Vital Signs ED Triage Vitals  Enc Vitals Group     BP 01/30/20 1459 126/85     Pulse Rate 01/30/20 1459 (!) 107     Resp 01/30/20 1459 18     Temp 01/30/20 1459 99 F (37.2 C)     Temp Source 01/30/20 1459 Oral     SpO2 01/30/20 1459 97 %     Weight 01/30/20 1457 240 lb (108.9 kg)     Height 01/30/20  1457 6\' 3"  (1.905 m)     Head Circumference --      Peak Flow --      Pain Score 01/30/20 1456 6     Pain Loc --      Pain Edu? --      Excl. in [place]? --    Updated Vital Signs BP 126/85 (BP Location: Right Arm)    Pulse (!) 107    Temp 99 F (37.2 C) (Oral)    Resp 18    Ht 6\' 3"  (1.905 m)    Wt 108.9 kg    SpO2 97%    BMI 30.00 kg/m   Visual Acuity Right Eye Distance:   Left Eye Distance:   Bilateral Distance:    Right Eye Near:   Left Eye Near:    Bilateral Near:     Physical Exam Vitals and nursing note reviewed.  Constitutional:      General: He is not in acute distress.    Appearance: Normal appearance. He is not ill-appearing.  HENT:     Head: Normocephalic and atraumatic.  Eyes:     General:        Right eye: No discharge.        Left eye: No discharge.     Conjunctiva/sclera:  Conjunctivae normal.  Cardiovascular:     Rate and Rhythm: Normal rate and regular rhythm.  Pulmonary:     Effort: No respiratory distress.     Breath sounds: Wheezing present.  Neurological:     Mental Status: He is alert.  Psychiatric:        Mood and Affect: Mood normal.        Behavior: Behavior normal.    UC Treatments / Results  Labs (all labs ordered are listed, but only abnormal results are displayed) Labs Reviewed - No data to display  EKG   Radiology DG Chest 2 View  Result Date: 01/30/2020 CLINICAL DATA:  Cough and shortness of breath EXAM: CHEST - 2 VIEW COMPARISON:  None. FINDINGS: Lungs are clear. Heart size and pulmonary vascularity are normal. No adenopathy. No bone lesions. IMPRESSION: Lungs clear.  Cardiac silhouette normal. Electronically Signed   By: Lowella Grip III M.D.   On: 01/30/2020 16:04    Procedures Procedures (including critical care time)  Medications Ordered in UC Medications - No data to display  Initial Impression / Assessment and Plan / UC Course  I have reviewed the triage vital signs and the nursing notes.  Pertinent labs & imaging results that were available during my care of the patient were reviewed by me and considered in my medical decision making (see chart for details).    39 year old male presents with a viral respiratory infection.  X-ray obtained today and was independently reviewed by me.  Interpretation: No acute cardiopulmonary findings.  Tessalon Perles and albuterol as directed.  Supportive care.  Final Clinical Impressions(s) / UC Diagnoses   Final diagnoses:  Viral respiratory infection     Discharge Instructions     This is viral.  Medications as prescribed.  Take care  Dr. Lacinda Axon    ED Prescriptions    Medication Sig Dispense Auth. Provider   benzonatate (TESSALON) 200 MG capsule Take 1 capsule (200 mg total) by mouth 3 (three) times daily as needed for cough. 30 capsule Linn Goetze G, DO    albuterol (VENTOLIN HFA) 108 (90 Base) MCG/ACT inhaler Inhale 1-2 puffs into the lungs every 6 (six) hours as needed for wheezing  or shortness of breath. 18 g Tommie Sams, DO     PDMP not reviewed this encounter.   Tommie Sams, [place] 01/30/20 [phone]

## 2020-01-30 NOTE — ED Triage Notes (Signed)
Patient c/o cough, sob, headache and chills that started yesterday.

## 2020-01-30 NOTE — Discharge Instructions (Signed)
This is viral. ° °Medications as prescribed. ° °Take care ° °Dr. Dola Lunsford  °

## 2020-01-31 ENCOUNTER — Telehealth: Payer: Self-pay | Admitting: Emergency Medicine

## 2020-01-31 NOTE — Telephone Encounter (Signed)
Patient's wife had left a message stating that her husband was feeling worse and wanted to speak with a nurse.  Spoke to patient and stated that his cough was getting worse and that his temperature was up and down.  Patient states that he has an infection and wanted antibiotics.  Patient was seen yesterday by Dr. Adriana Simas and his chest x-ray was negative lungs clear and was being treated for a viral infection.  Patient was notified that providers do not treat viral infections with antibiotics.  Patient was informed that if his sypmtoms were worse that he needed to follow-up here or in the ED. Patient then gave me permission to speak to his wife.  Patient's wife asked questions about coding.  I gave the for [facility] [address] billing number to patient's wife.

## 2020-02-15 ENCOUNTER — Other Ambulatory Visit: Payer: Self-pay | Admitting: Family Medicine

## 2020-12-07 ENCOUNTER — Other Ambulatory Visit: Payer: Self-pay | Admitting: Orthopaedic Surgery

## 2020-12-07 DIAGNOSIS — M5412 Radiculopathy, cervical region: Secondary | ICD-10-CM

## 2020-12-13 ENCOUNTER — Ambulatory Visit
Admission: RE | Admit: 2020-12-13 | Discharge: 2020-12-13 | Disposition: A | Discharge status: Home / Self Care | Payer: BC Managed Care – PPO | Source: Ambulatory Visit | Attending: Orthopaedic Surgery | Admitting: Orthopaedic Surgery

## 2020-12-13 ENCOUNTER — Other Ambulatory Visit: Payer: Self-pay

## 2020-12-13 DIAGNOSIS — M5412 Radiculopathy, cervical region: Secondary | ICD-10-CM

## 2021-08-20 IMAGING — MR MR CERVICAL SPINE W/O CM
4 of 5 series · 29 of 48 positions shown · non-contrast
Comparison: None available.

CLINICAL DATA: Initial evaluation for posterior neck pain with
radiation into the left arm and hand since Monday June, 2020.

EXAM:
MRI CERVICAL SPINE WITHOUT CONTRAST
TECHNIQUE: Multiplanar, multisequence MR imaging of the cervical spine was
performed. No intravenous contrast was administered.

[Series 2: T2 · sagittal · 3.0mm · 0.69mm/px · 8 of 19 slices shown (1 of 2)]
[im 1/19]
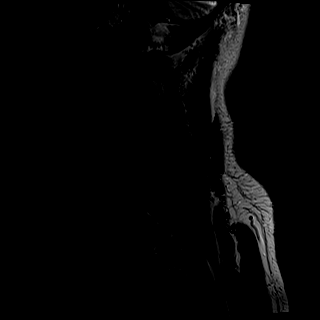
[im 3/19]
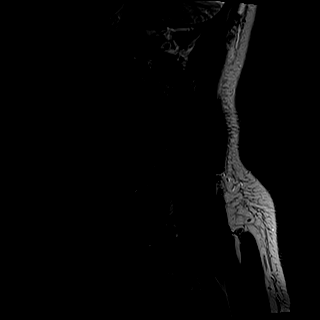
[im 6/19]
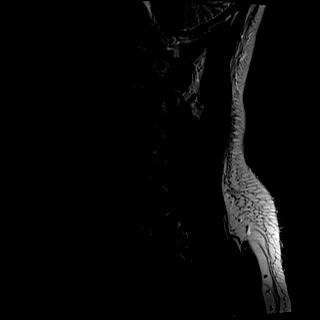
[im 8/19]
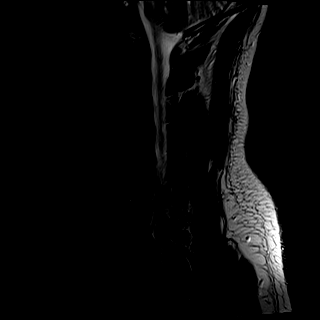
[im 11/19]
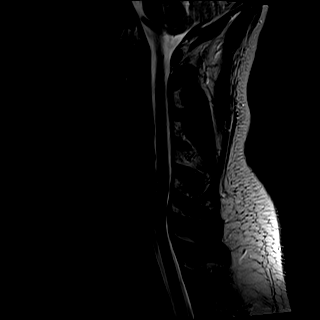
[im 13/19]
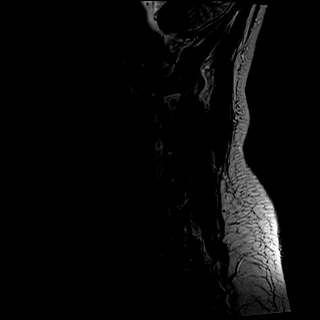
[im 16/19]
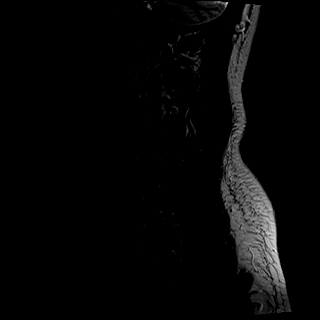
[im 19/19]
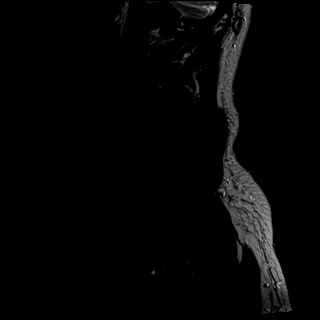

[Series 3: T1 · sagittal · 3.0mm · 0.43mm/px · 8 of 19 slices shown]
[im 1/19]
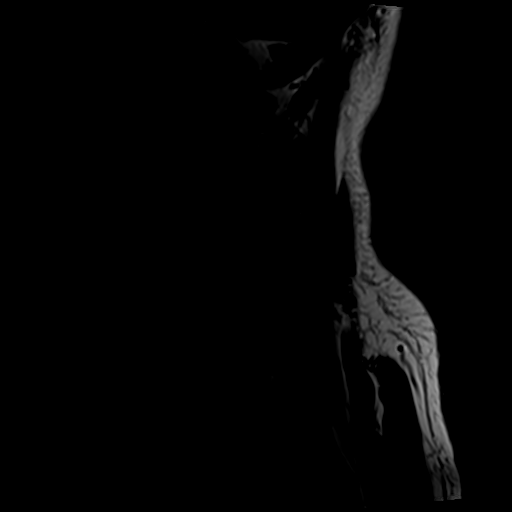
[im 3/19]
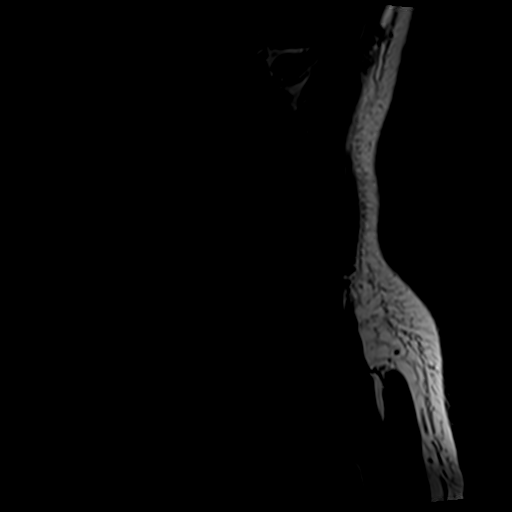
[im 6/19]
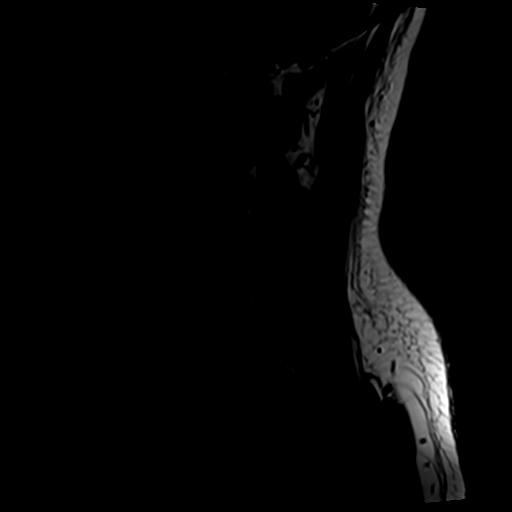
[im 8/19]
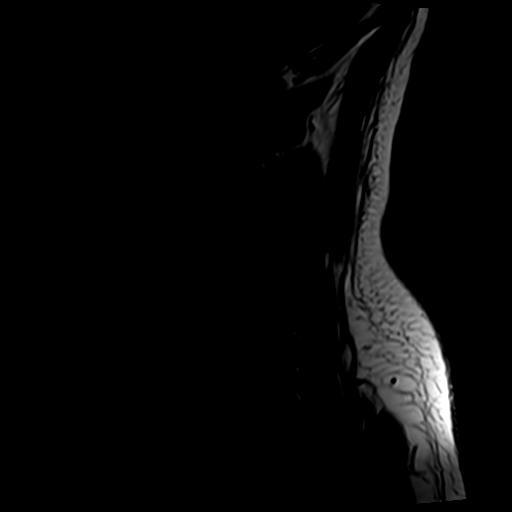
[im 11/19]
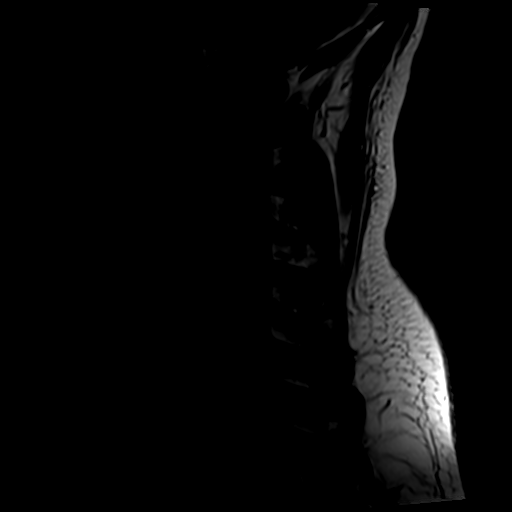
[im 13/19]
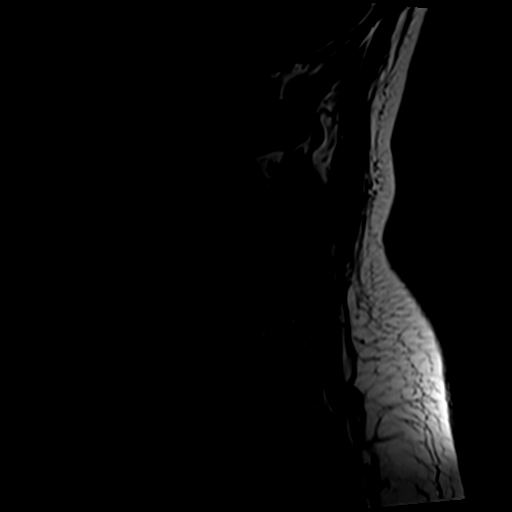
[im 16/19]
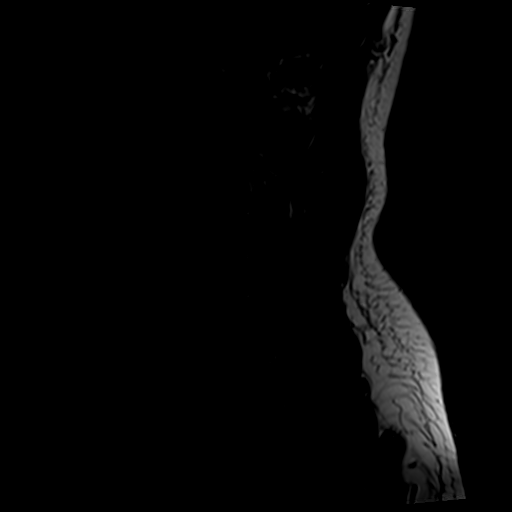
[im 19/19]
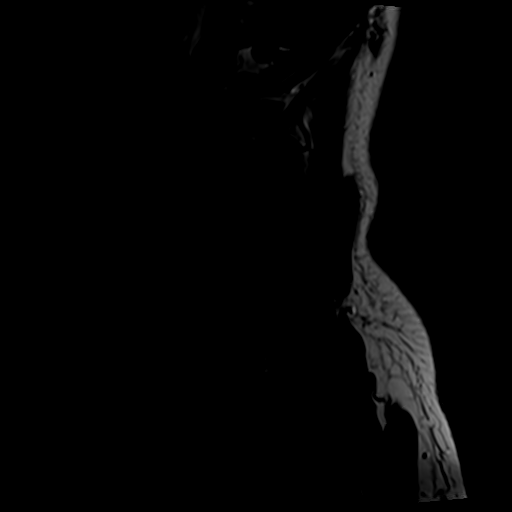

[Series 4: tir sag · sagittal · 3.0mm · 0.43mm/px · 4 of 19 slices shown]
[im 1/19]
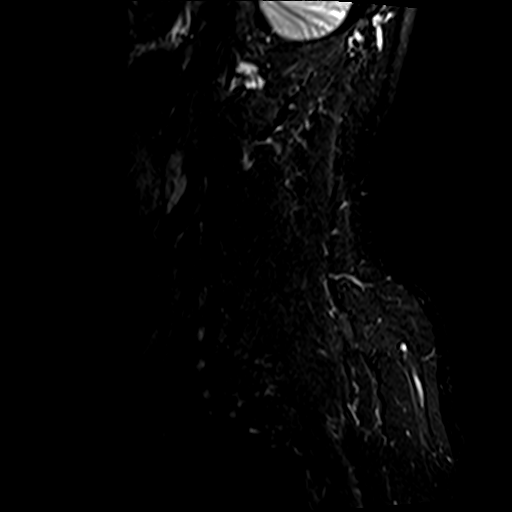
[im 3/19]
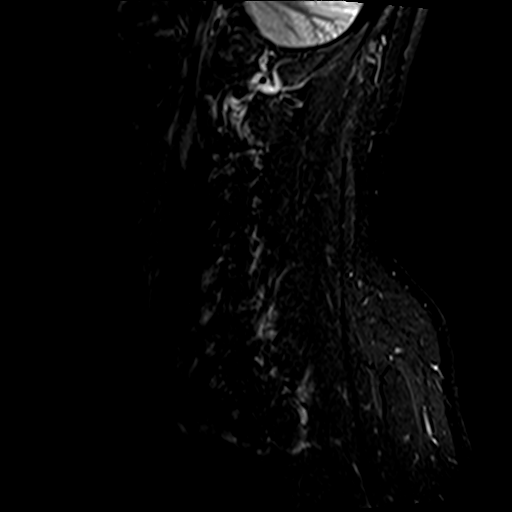
[im 11/19]
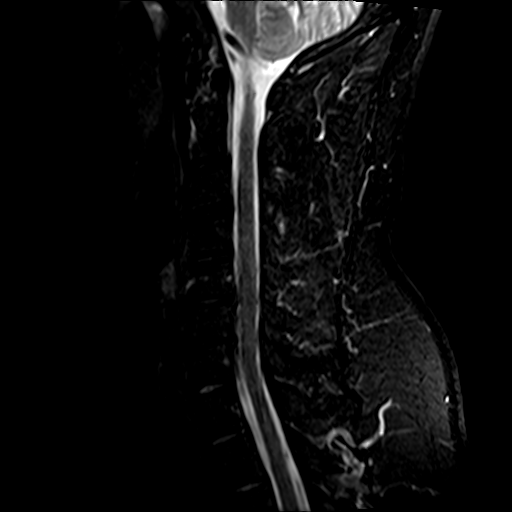
[im 16/19]
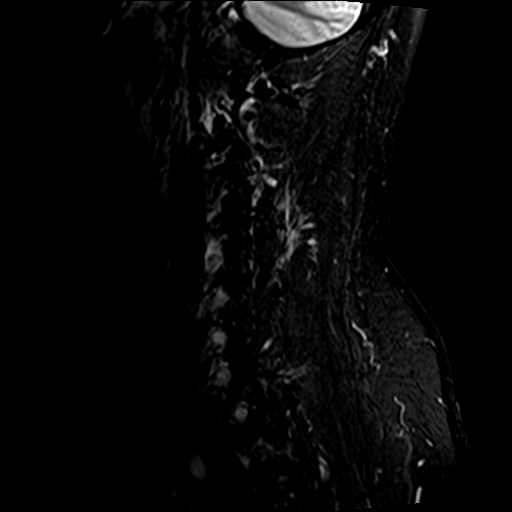

[Series 6: T2 · axial · 3.0mm · 0.70mm/px · z∈[-28,+72]mm · 9 of 28 slices shown (2 of 2)]
[im 1/28]
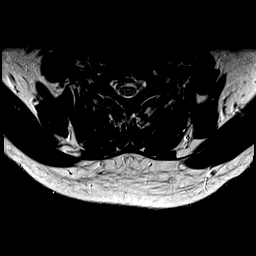
[im 5/28]
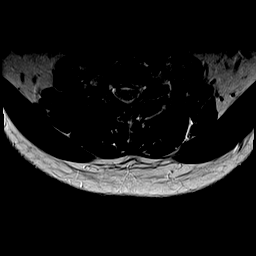
[im 8/28]
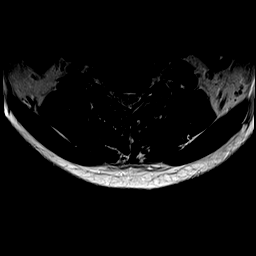
[im 13/28]
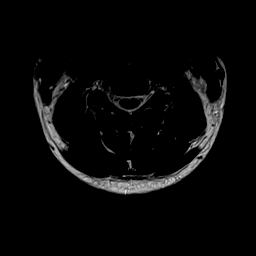
[im 15/28]
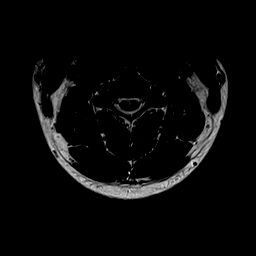
[im 20/28]
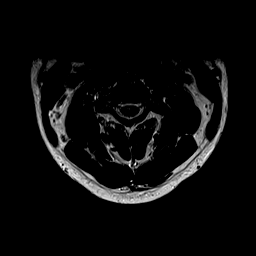
[im 23/28]
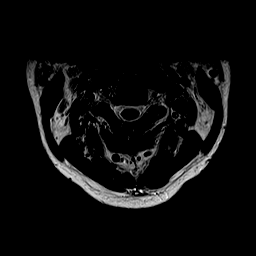
[im 25/28]
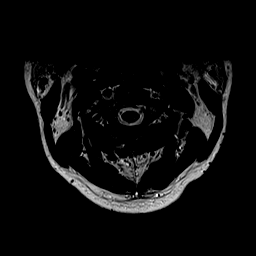
[im 28/28]
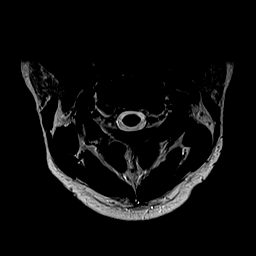

[29 of 48 positions shown; findings below may reference images not displayed]

FINDINGS: Alignment: Straightening with mild reversal of the normal cervical
lordosis. No listhesis or static subluxation.

Vertebrae: Vertebral body height well maintained without acute or
chronic fracture. Bone marrow signal intensity within normal limits.
No discrete or worrisome osseous lesions. Mild discogenic reactive
endplate change present about the C5-6 and C6-7 interspaces. No
other abnormal marrow edema.

Cord: Normal signal and morphology.

Posterior Fossa, vertebral arteries, paraspinal tissues: Visualized
brain and posterior fossa within normal limits. Craniocervical
junction normal. Paraspinous and prevertebral soft tissues within
normal limits. Normal intravascular flow voids seen within the
vertebral arteries bilaterally.

Disc levels:

C2-C3: Unremarkable.

C3-C4:  Unremarkable.

C4-C5:  Unremarkable.

C5-C6: Degenerative intervertebral disc space narrowing with diffuse
disc bulge and disc desiccation. Broad posterior disc osteophyte
flattens and partially faces the ventral thecal sac with resultant
mild spinal stenosis. Minimal flattening of the ventral cord without
cord signal changes. Left worse than right uncovertebral hypertrophy
with resultant moderate left with mild right C6 foraminal narrowing.

C6-C7: Degenerative intervertebral disc space narrowing with diffuse
disc osteophyte complex. Flattening and partial effacement of the
ventral thecal sac with resultant mild spinal stenosis. Minimal cord
flattening without cord signal changes. Moderate bilateral C7
foraminal stenosis.

C7-T1:  Unremarkable.

Visualized upper thoracic spine demonstrates no significant finding.
IMPRESSION: 1. Degenerative disc osteophyte at C5-6 with resultant mild spinal
stenosis, with moderate left C6 foraminal narrowing.
2. Degenerative spondylosis at C6-7 with resultant mild spinal
stenosis, with moderate bilateral C7 foraminal stenosis.
3. Otherwise essentially normal MRI of the cervical spine.
